# Patient Record
Sex: Male | Born: 2000 | Race: White | Hispanic: No | Marital: Single | Smoking: Never smoker
Health system: Southern US, Community
[De-identification: ages and names within clinical notes are randomized; demographics above are authoritative.]

## PROBLEM LIST (undated history)

## (undated) DIAGNOSIS — N200 Calculus of kidney: Secondary | ICD-10-CM

## (undated) DIAGNOSIS — L309 Dermatitis, unspecified: Secondary | ICD-10-CM

## (undated) HISTORY — DX: Calculus of kidney: N20.0

---
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
## 2021-04-17 ENCOUNTER — Other Ambulatory Visit: Payer: Self-pay

## 2021-04-17 ENCOUNTER — Encounter (HOSPITAL_BASED_OUTPATIENT_CLINIC_OR_DEPARTMENT_OTHER): Payer: Self-pay | Admitting: Emergency Medicine

## 2021-04-17 ENCOUNTER — Emergency Department (HOSPITAL_BASED_OUTPATIENT_CLINIC_OR_DEPARTMENT_OTHER)
Admission: EM | Admit: 2021-04-17 | Discharge: 2021-04-17 | Disposition: A | Discharge status: Home / Self Care | Payer: Medicaid Other | Attending: Emergency Medicine | Admitting: Emergency Medicine

## 2021-04-17 DIAGNOSIS — Z7722 Contact with and (suspected) exposure to environmental tobacco smoke (acute) (chronic): Secondary | ICD-10-CM | POA: Insufficient documentation

## 2021-04-17 DIAGNOSIS — M545 Low back pain, unspecified: Secondary | ICD-10-CM | POA: Insufficient documentation

## 2021-04-17 DIAGNOSIS — U071 COVID-19: Secondary | ICD-10-CM | POA: Insufficient documentation

## 2021-04-17 DIAGNOSIS — R509 Fever, unspecified: Secondary | ICD-10-CM | POA: Diagnosis present

## 2021-04-17 HISTORY — DX: Calculus of kidney: N20.0

## 2021-04-17 LAB — URINALYSIS, ROUTINE W REFLEX MICROSCOPIC
Bilirubin Urine: NEGATIVE
Glucose, UA: NEGATIVE mg/dL
Hgb urine dipstick: NEGATIVE
Ketones, ur: NEGATIVE mg/dL
Leukocytes,Ua: NEGATIVE
Nitrite: NEGATIVE
Protein, ur: NEGATIVE mg/dL
Specific Gravity, Urine: 1.025 (ref 1.005–1.030)
pH: 6 (ref 5.0–8.0)

## 2021-04-17 MED ORDER — IBUPROFEN 400 MG PO TABS
600.0000 mg | ORAL_TABLET | Freq: Once | ORAL | Status: AC
  Administered 2021-04-17: 600 mg via ORAL
  Filled 2021-04-17: qty 1

## 2021-04-17 NOTE — ED Provider Notes (Signed)
[facility] EMERGENCY DEPARTMENT Provider Note   CSN: [id number] Arrival date & time: 04/17/21  1006     History Chief Complaint  Patient presents with   Back Pain    Matthew Hebert is a 20 y.o. male.  Patient with history of kidney stones presents emergency department for evaluation of lower back pain.  States that symptoms started last night.  He denies dysuria or blood noted in the urine.  No vomiting.  He has had a little bit of a headache and chills today despite being a warm environment.  He has a temperature of 100.8 F at time of my exam.  He denies ear pain, eye pain, congestion, sore throat.  No cough.  No vomiting or diarrhea.  No abdominal pain.  Current symptoms are not as severe as when he was diagnosed with a kidney stone.  No known sick contacts.  Onset of symptoms acute.  Course is constant.      Past Medical History:  Diagnosis Date   Eczema    Kidney stone     There are no problems to display for this patient.   History reviewed. No pertinent surgical history.     No family history on file.  Social History   Tobacco Use   Smoking status: Never    Passive exposure: Yes   Smokeless tobacco: Never  Vaping Use   Vaping Use: Every day  Substance Use Topics   Alcohol use: Yes    Comment: beer   Drug use: Yes    Types: Marijuana    Home Medications Prior to Admission medications   Medication Sig Start Date End Date Taking? Authorizing Provider  ibuprofen (ADVIL,MOTRIN) 100 MG/5ML suspension Take 300 mg by mouth once. Patient was given this medication for a headache.    [provider]    Allergies    Patient has no known allergies.  Review of Systems   Review of Systems  Constitutional:  Positive for chills and fever.  HENT:  Negative for rhinorrhea and sore throat.   Eyes:  Negative for redness.  Respiratory:  Negative for cough.   Cardiovascular:  Negative for chest pain.  Gastrointestinal:  Negative for abdominal  pain, diarrhea, nausea and vomiting.  Genitourinary:  Negative for dysuria and hematuria.  Musculoskeletal:  Positive for back pain and myalgias.  Skin:  Negative for rash.  Neurological:  Positive for headaches.   Physical Exam Updated Vital Signs BP 114/65 (BP Location: Right Arm)   Pulse 96   Temp (!) 100.8 F (38.2 C) (Oral)   Resp 20   Ht 6\' 1"  (1.854 m)   Wt 70.3 kg   SpO2 97%   BMI 20.45 kg/m   Physical Exam Vitals and nursing note reviewed.  Constitutional:      General: He is not in acute distress.    Appearance: He is well-developed.     Comments: Temperature 100.8 F orally, measured by myself  HENT:     Head: Normocephalic and atraumatic.     Right Ear: Tympanic membrane, ear canal and external ear normal.     Left Ear: Tympanic membrane, ear canal and external ear normal.     Nose: Nose normal.     Mouth/Throat:     Pharynx: Posterior oropharyngeal erythema (Mild) present. No oropharyngeal exudate.  Eyes:     General:        Right eye: No discharge.        Left eye: No discharge.  Conjunctiva/sclera: Conjunctivae normal.  Neck:     Comments: No meningeal signs. Cardiovascular:     Rate and Rhythm: Normal rate and regular rhythm.     Heart sounds: Normal heart sounds.  Pulmonary:     Effort: Pulmonary effort is normal.     Breath sounds: Normal breath sounds.  Abdominal:     Palpations: Abdomen is soft.     Tenderness: There is no abdominal tenderness. There is no guarding or rebound.  Musculoskeletal:     Cervical back: Normal range of motion and neck supple.  Skin:    General: Skin is warm and dry.  Neurological:     Mental Status: He is alert.    ED Results / Procedures / Treatments   Labs (all labs ordered are listed, but only abnormal results are displayed) Labs Reviewed  SARS CORONAVIRUS 2 (TAT 6-24 HRS)  URINALYSIS, ROUTINE W REFLEX MICROSCOPIC    EKG None  Radiology No results found.  Procedures Procedures   Medications  Ordered in ED Medications  ibuprofen (ADVIL) tablet 600 mg (has no administration in time range)    ED Course  I have reviewed the triage vital signs and the nursing notes.  Pertinent labs & imaging results that were available during my care of the patient were reviewed by me and considered in my medical decision making (see chart for details).  Patient seen and examined.  UA already obtained and is negative.  Patient's symptoms are not clinically consistent with kidney stones.  He does have a worsening fever here making me think that his pain is related to muscle aches from infection.  He appears well, nontoxic.  Low concern for sepsis or meningitis.  We will give OTC NSAIDs, check COVID test.  Encouraged avoidance of others until symptoms are improved and COVID test has returned.  Detailed discussion had with with patient regarding COVID-19 precautions and written instructions given as well.  We discussed need to isolate themselves for 5 days from onset of symptoms and have 24 hours of improvement prior to breaking isolation.  We discussed that when breaking isolation, mask wearing for 5 additional days is required.  We discussed signs symptoms to return which include worsening shortness of breath, trouble breathing, or increased work of breathing.  Also return with persistent vomiting, confusion, passing out, or if they have any other concerns. Counseled on the need for rest and good hydration. Discussed that high-risk contacts should be aware of positive result and they need to quarantine and be tested if they develop any symptoms. Patient verbalizes understanding.   Matthew Hebert was evaluated in Emergency Department on 04/17/2021 for the symptoms described in the history of present illness. He was evaluated in the context of the global COVID-19 pandemic, which necessitated consideration that the patient might be at risk for infection with the SARS-CoV-2 virus that causes COVID-19. Institutional  protocols and algorithms that pertain to the evaluation of patients at risk for COVID-19 are in a state of rapid change based on information released by regulatory bodies including the CDC and federal and state organizations. These policies and algorithms were followed during the patient's care in the ED.   Vital signs reviewed and are as follows: BP 114/65 (BP Location: Right Arm)   Pulse 96   Temp (!) 100.8 F (38.2 C) (Oral)   Resp 20   Ht 6\' 1"  (1.854 m)   Wt 70.3 kg   SpO2 97%   BMI 20.45 kg/m  MDM Rules/Calculators/A&P                          Patient with low back pain, likely muscular.  Normal UA.  No neurological deficits.  Found to have worsening fever here.  Treated as above.  Do not suspect meningitis at this time.  Concern for viral infection or possibly COVID.  Testing sent.  Treatment plan as above.  No high risk features for severe COVID.   Final Clinical Impression(s) / ED Diagnoses Final diagnoses:  Febrile illness  Acute bilateral low back pain without sciatica    Rx / DC Orders ED Discharge Orders     None        Renne Crigler, Cordelia Poche 04/17/21 1128    Tilden Fossa, MD 04/18/21 1234

## 2021-04-17 NOTE — Discharge Instructions (Signed)
Please read and follow all provided instructions.  Your diagnoses today include:  1. Febrile illness   2. Acute bilateral low back pain without sciatica     Tests performed today include: Vital signs. See below for your results today.  Urine test - no sign of blood to suggest kidney stone or sign of infection COVID test - pending, check mychart for results  Medications prescribed:  None  Take any prescribed medications only as directed. Treatment for your infection is aimed at treating the symptoms. There are no medications, such as antibiotics, that will cure your infection.   Home care instructions:  Follow any educational materials contained in this packet.   Your illness is contagious and can be spread to others, especially during the first 3 or 4 days. It cannot be cured by antibiotics or other medicines. Take basic precautions such as washing your hands often, covering your mouth when you cough or sneeze, and avoiding public places where you could spread your illness to others.   Please continue drinking plenty of fluids.  Use over-the-counter medicines as needed as directed on packaging for symptom relief.  You may also use ibuprofen or tylenol as directed on packaging for pain or fever.  Do not take multiple medicines containing Tylenol or acetaminophen to avoid taking too much of this medication.  If you are positive for Covid-19, you should isolate yourself and not be exposed to other people for 5 days after your symptoms began. If you are not feeling better at day 5, you need to isolate yourself for a total of 10 days. If you are feeling better by day 5, you should wear a mask properly, over your nose and mouth, at all times while around other people until 10 days after your symptoms started.   Follow-up instructions: Please follow-up with your primary care provider as needed for further evaluation of your symptoms if you are not feeling better.   Return instructions:  Please  return to the Emergency Department if you experience worsening symptoms.  Return to the emergency department if you have worsening shortness of breath breathing or increased work of breathing, persistent vomiting RETURN IMMEDIATELY IF you develop shortness of breath, confusion or altered mental status, a new rash, become dizzy, faint, or poorly responsive, or are unable to be cared for at home. Please return if you have persistent vomiting and cannot keep down fluids or develop a fever that is not controlled by tylenol or motrin.   Please return if you have any other emergent concerns.  Additional Information:  Your vital signs today were: BP 114/65 (BP Location: Right Arm)   Pulse 96   Temp (!) 100.8 F (38.2 C) (Oral)   Resp 20   Ht 6\' 1"  (1.854 m)   Wt 70.3 kg   SpO2 97%   BMI 20.45 kg/m  If your blood pressure (BP) was elevated above 135/85 this visit, please have this repeated by your doctor within one month. --------------

## 2021-04-17 NOTE — ED Triage Notes (Signed)
States," My kidneys are hurting me" Hx of kidney stone x 1 year ago. Lower back pain, non-radiating, no dysuria

## 2021-04-18 LAB — SARS CORONAVIRUS 2 (TAT 6-24 HRS): SARS Coronavirus 2: POSITIVE — AB

## 2021-10-12 ENCOUNTER — Emergency Department (HOSPITAL_BASED_OUTPATIENT_CLINIC_OR_DEPARTMENT_OTHER)
Admission: EM | Admit: 2021-10-12 | Discharge: 2021-10-12 | Disposition: A | Discharge status: Home / Self Care | Payer: Medicaid Other | Attending: Emergency Medicine | Admitting: Emergency Medicine

## 2021-10-12 ENCOUNTER — Emergency Department (HOSPITAL_BASED_OUTPATIENT_CLINIC_OR_DEPARTMENT_OTHER): Payer: Medicaid Other

## 2021-10-12 ENCOUNTER — Other Ambulatory Visit: Payer: Self-pay

## 2021-10-12 ENCOUNTER — Encounter (HOSPITAL_BASED_OUTPATIENT_CLINIC_OR_DEPARTMENT_OTHER): Payer: Self-pay | Admitting: Emergency Medicine

## 2021-10-12 DIAGNOSIS — R0789 Other chest pain: Secondary | ICD-10-CM | POA: Diagnosis present

## 2021-10-12 DIAGNOSIS — Z20822 Contact with and (suspected) exposure to covid-19: Secondary | ICD-10-CM | POA: Diagnosis not present

## 2021-10-12 DIAGNOSIS — R079 Chest pain, unspecified: Secondary | ICD-10-CM

## 2021-10-12 DIAGNOSIS — R110 Nausea: Secondary | ICD-10-CM | POA: Insufficient documentation

## 2021-10-12 DIAGNOSIS — Z7722 Contact with and (suspected) exposure to environmental tobacco smoke (acute) (chronic): Secondary | ICD-10-CM | POA: Diagnosis not present

## 2021-10-12 DIAGNOSIS — R630 Anorexia: Secondary | ICD-10-CM | POA: Diagnosis not present

## 2021-10-12 LAB — RESP PANEL BY RT-PCR (FLU A&B, COVID) ARPGX2
Influenza A by PCR: NEGATIVE
Influenza B by PCR: NEGATIVE
SARS Coronavirus 2 by RT PCR: NEGATIVE

## 2021-10-12 MED ORDER — ONDANSETRON 4 MG PO TBDP
4.0000 mg | ORAL_TABLET | Freq: Once | ORAL | Status: AC
  Administered 2021-10-12: 4 mg via ORAL
  Filled 2021-10-12: qty 1

## 2021-10-12 MED ORDER — ONDANSETRON HCL 4 MG PO TABS: 4.0000 mg | ORAL_TABLET | Freq: Four times a day (QID) | ORAL | 0 refills | Status: AC

## 2021-10-12 MED ORDER — ALUM & MAG HYDROXIDE-SIMETH 200-200-20 MG/5ML PO SUSP
30.0000 mL | Freq: Once | ORAL | Status: AC
  Administered 2021-10-12: 30 mL via ORAL
  Filled 2021-10-12: qty 30

## 2021-10-12 MED ORDER — FAMOTIDINE 20 MG PO TABS: 20.0000 mg | ORAL_TABLET | Freq: Two times a day (BID) | ORAL | 0 refills | Status: AC

## 2021-10-12 MED ORDER — LIDOCAINE VISCOUS HCL 2 % MT SOLN
15.0000 mL | Freq: Once | OROMUCOSAL | Status: AC
  Administered 2021-10-12: 15 mL via ORAL
  Filled 2021-10-12: qty 15

## 2021-10-12 NOTE — ED Triage Notes (Signed)
Pt presents to ED POV. Pt c/o intermittent CP x3d. Pt reports that pain is in different location every time it happens. Pt reports significant cardiac hx in his family.

## 2021-10-12 NOTE — Discharge Instructions (Signed)
Take pepcid and zofran as prescribed   Please follow up with your regular doctor and your heart doctor   Please return to the emergency department for any new or worsening symptoms.

## 2021-10-12 NOTE — ED Provider Notes (Signed)
[facility] EMERGENCY DEPT Provider Note   CSN: [id number] Arrival date & time: 10/12/21  1934     History Chief Complaint  Patient presents with   Chest Pain    OMRAN NOHR is a 20 y.o. male.  HPI   Pt is a 20 y/o male who presents to the ED today for eval of chest pain. States sxs have been intermittent for the last 3 days. Sxs are located to different areas of his chest and seem to move around. States it feels like gas. He does not have any symptoms currently. He has had some nausea with eating as well. Denies abd pain, vomiting, sob, cough, pleuritic pain, fever.  Past Medical History:  Diagnosis Date   Eczema    Kidney stone     There are no problems to display for this patient.   History reviewed. No pertinent surgical history.     History reviewed. No pertinent family history.  Social History   Tobacco Use   Smoking status: Never    Passive exposure: Yes   Smokeless tobacco: Never  Vaping Use   Vaping Use: Every day  Substance Use Topics   Alcohol use: Yes    Comment: beer   Drug use: Yes    Types: Marijuana    Home Medications Prior to Admission medications   Medication Sig Start Date End Date Taking? Authorizing Provider  famotidine (PEPCID) 20 MG tablet Take 1 tablet (20 mg total) by mouth 2 (two) times daily. 10/12/21  Yes Shiraz Bastyr S, PA-C  ondansetron (ZOFRAN) 4 MG tablet Take 1 tablet (4 mg total) by mouth every 6 (six) hours. 10/12/21  Yes Regena Delucchi S, PA-C  ibuprofen (ADVIL,MOTRIN) 100 MG/5ML suspension Take 300 mg by mouth once. Patient was given this medication for a headache.    [provider]    Allergies    Patient has no known allergies.  Review of Systems   Review of Systems  Constitutional:  Negative for fever.  HENT:  Negative for ear pain and sore throat.   Eyes:  Negative for visual disturbance.  Respiratory:  Negative for cough and shortness of breath.   Cardiovascular:  Positive  for chest pain.  Gastrointestinal:  Positive for nausea. Negative for abdominal pain, constipation, diarrhea and vomiting.  Genitourinary:  Negative for dysuria and hematuria.  Musculoskeletal:  Positive for arthralgias. Negative for back pain.  Skin:  Positive for color change. Negative for rash.  Neurological:  Negative for headaches.  All other systems reviewed and are negative.  Physical Exam Updated Vital Signs BP 126/72 (BP Location: Left Arm)   Pulse (!) 101   Temp 98.6 F (37 C)   Resp 18   Ht 6' (1.829 m)   Wt 68 kg   SpO2 96%   BMI 20.34 kg/m   Physical Exam Vitals and nursing note reviewed.  Constitutional:      General: He is not in acute distress.    Appearance: He is well-developed.  HENT:     Head: Normocephalic and atraumatic.  Eyes:     Conjunctiva/sclera: Conjunctivae normal.  Cardiovascular:     Rate and Rhythm: Normal rate and regular rhythm.     Heart sounds: Normal heart sounds. No murmur heard. Pulmonary:     Effort: Pulmonary effort is normal. No respiratory distress.     Breath sounds: Normal breath sounds. No decreased breath sounds, wheezing, rhonchi or rales.  Abdominal:     Palpations: Abdomen is soft.  Tenderness: There is no abdominal tenderness.  Musculoskeletal:        General: No swelling.     Cervical back: Neck supple.  Skin:    General: Skin is warm and dry.     Capillary Refill: Capillary refill takes less than 2 seconds.  Neurological:     Mental Status: He is alert.  Psychiatric:        Mood and Affect: Mood normal.    ED Results / Procedures / Treatments   Labs (all labs ordered are listed, but only abnormal results are displayed) Labs Reviewed  RESP PANEL BY RT-PCR (FLU A&B, COVID) [id number]    EKG None  Radiology DG Chest Port 1 View  Result Date: 10/12/2021 CLINICAL DATA:  Chest pain EXAM: PORTABLE CHEST 1 VIEW COMPARISON:  None. FINDINGS: Normal mediastinum and cardiac silhouette. Normal pulmonary  vasculature. No evidence of effusion, infiltrate, or pneumothorax. No acute bony abnormality. IMPRESSION: Normal chest radiograph. Electronically Signed   By: Genevive Bi M.D.   On: 10/12/2021 20:52    Procedures Procedures   Medications Ordered in ED Medications  alum & mag hydroxide-simeth (MAALOX/MYLANTA) 200-200-20 MG/5ML suspension 30 mL (30 mLs Oral Given 10/12/21 2102)    And  lidocaine (XYLOCAINE) 2 % viscous mouth solution 15 mL (15 mLs Oral Given 10/12/21 2059)  ondansetron (ZOFRAN-ODT) disintegrating tablet 4 mg (4 mg Oral Given 10/12/21 2055)    ED Course  I have reviewed the triage vital signs and the nursing notes.  Pertinent labs & imaging results that were available during my care of the patient were reviewed by me and considered in my medical decision making (see chart for details).    MDM Rules/Calculators/A&P                          20 year old male presents with intermittent chest pain that moves around his chest and last for few minutes at a time resolved on its own.  At the time of my evaluation he is not any chest pain.  He describes it as a gas feeling.  He is further reporting nausea whenever he eats and decreased appetite.  I suspect that his symptoms are more likely related to a GI cause rather than from a cardiac or pulmonary cause.  His chest x-ray is clear and his EKG is unremarkable.  I did send a COVID and flu test due to his lack of appetite and this is pending at the time of discharge.  Patient will follow up on his MyChart for the result.  I gave him a GI cocktail as well as some Zofran in the ED and on reassessment he actually states that he feels improved.  This further supports suspicion that this is likely GI related.  I will start him on Pepcid and Zofran for home.  Have advised that he follow-up with his PCP and/cardiologist for further evaluation of his symptoms.  I have low suspicion for any emergent cause at this time and feel he is appropriate  for discharge with outpatient follow-up.  He voiced understanding the plan and reasons to return.  Questions answered.  Patient stable for discharge.   Final Clinical Impression(s) / ED Diagnoses Final diagnoses:  Atypical chest pain    Rx / DC Orders ED Discharge Orders          Ordered    ondansetron (ZOFRAN) 4 MG tablet  Every 6 hours        10/12/21 2133  famotidine (PEPCID) 20 MG tablet  2 times daily        10/12/21 2133             Rodney Booze, PA-C 10/12/21 2138    Wynona Dove A, DO 10/14/21 0730

## 2021-11-07 ENCOUNTER — Emergency Department (HOSPITAL_BASED_OUTPATIENT_CLINIC_OR_DEPARTMENT_OTHER)
Admission: EM | Admit: 2021-11-07 | Discharge: 2021-11-07 | Disposition: A | Discharge status: Home / Self Care | Payer: Medicaid Other | Attending: Emergency Medicine | Admitting: Emergency Medicine

## 2021-11-07 ENCOUNTER — Other Ambulatory Visit: Payer: Self-pay

## 2021-11-07 ENCOUNTER — Encounter (HOSPITAL_BASED_OUTPATIENT_CLINIC_OR_DEPARTMENT_OTHER): Payer: Self-pay | Admitting: Obstetrics and Gynecology

## 2021-11-07 DIAGNOSIS — R1031 Right lower quadrant pain: Secondary | ICD-10-CM | POA: Diagnosis not present

## 2021-11-07 DIAGNOSIS — R109 Unspecified abdominal pain: Secondary | ICD-10-CM

## 2021-11-07 LAB — COMPREHENSIVE METABOLIC PANEL
ALT: 39 U/L (ref 0–44)
AST: 26 U/L (ref 15–41)
Albumin: 4.7 g/dL (ref 3.5–5.0)
Alkaline Phosphatase: 65 U/L (ref 38–126)
Anion gap: 8 (ref 5–15)
BUN: 11 mg/dL (ref 6–20)
CO2: 27 mmol/L (ref 22–32)
Calcium: 9.8 mg/dL (ref 8.9–10.3)
Chloride: 106 mmol/L (ref 98–111)
Creatinine, Ser: 0.86 mg/dL (ref 0.61–1.24)
GFR, Estimated: >60 mL/min (ref >60)
Glucose, Bld: 84 mg/dL (ref 70–99)
Potassium: 4 mmol/L (ref 3.5–5.1)
Sodium: 141 mmol/L (ref 135–145)
Total Bilirubin: 0.6 mg/dL (ref 0.3–1.2)
Total Protein: 7.5 g/dL (ref 6.5–8.1)

## 2021-11-07 LAB — CBC
HCT: 41.5 % (ref 39.0–52.0)
Hemoglobin: 14.3 g/dL (ref 13.0–17.0)
MCH: 30.4 pg (ref 26.0–34.0)
MCHC: 34.5 g/dL (ref 30.0–36.0)
MCV: 88.1 fL (ref 80.0–100.0)
Platelets: 240 10*3/uL (ref 150–400)
RBC: 4.71 MIL/uL (ref 4.22–5.81)
RDW: 11.8 % (ref 11.5–15.5)
WBC: 5 10*3/uL (ref 4.0–10.5)
nRBC: 0 % (ref 0.0–0.2)

## 2021-11-07 LAB — URINALYSIS, ROUTINE W REFLEX MICROSCOPIC
Bilirubin Urine: NEGATIVE
Glucose, UA: NEGATIVE mg/dL
Hgb urine dipstick: NEGATIVE
Ketones, ur: NEGATIVE mg/dL
Leukocytes,Ua: NEGATIVE
Nitrite: NEGATIVE
Protein, ur: NEGATIVE mg/dL
Specific Gravity, Urine: 1.028 (ref 1.005–1.030)
pH: 5.5 (ref 5.0–8.0)

## 2021-11-07 LAB — LIPASE, BLOOD: Lipase: 36 U/L (ref 11–51)

## 2021-11-07 NOTE — Discharge Instructions (Signed)
You can take over-the-counter medications as needed.  Return to the ER if you start having  pain, vomiting, fever

## 2021-11-07 NOTE — ED Notes (Signed)
RN provided AVS using Teachback Method. Patient verbalizes understanding of Discharge Instructions. Opportunity for Questioning and Answers were provided by RN. Patient Discharged from ED ambulatory to Home with Family. ? ?

## 2021-11-07 NOTE — ED Provider Notes (Signed)
[facility] EMERGENCY DEPT Provider Note   CSN: [id number] Arrival date & time: 11/07/21  1314     History  Chief Complaint  Patient presents with   Abdominal Pain    Matthew Hebert is a 21 y.o. male.   Abdominal Pain  Patient presented to the ED for abdominal evaluation.  Patient does not have any history of any significant medical problems. Patient states he started having some cramping pain in his lower abdomen more on the right side.  Initially did not think it was too severe.  He was able to go to sleep however woke up this morning the pain was still there.  He was concerned decided come in for evaluation.  While he has been waiting the symptoms have all resolved.  Does not have any pain or discomfort now.  He has not had any issues with nausea vomiting.  He is not having any issue with diarrhea or dysuria. Home Medications Prior to Admission medications   Medication Sig Start Date End Date Taking? Authorizing Provider  famotidine (PEPCID) 20 MG tablet Take 1 tablet (20 mg total) by mouth 2 (two) times daily. 10/12/21   Couture, Cortni S, PA-C  ibuprofen (ADVIL,MOTRIN) 100 MG/5ML suspension Take 300 mg by mouth once. Patient was given this medication for a headache.    [provider]  ondansetron (ZOFRAN) 4 MG tablet Take 1 tablet (4 mg total) by mouth every 6 (six) hours. 10/12/21   Couture, Cortni S, PA-C      Allergies    Patient has no known allergies.    Review of Systems   Review of Systems  Gastrointestinal:  Positive for abdominal pain.  All other systems reviewed and are negative.  Physical Exam Updated Vital Signs BP 121/68 (BP Location: Right Arm)    Pulse (!) 59    Temp (!) 97.5 F (36.4 C)    Resp 18    SpO2 100%  Physical Exam Vitals and nursing note reviewed.  Constitutional:      General: He is not in acute distress.    Appearance: He is well-developed.  HENT:     Head: Normocephalic and atraumatic.     Right Ear: External  ear normal.     Left Ear: External ear normal.  Eyes:     General: No scleral icterus.       Right eye: No discharge.        Left eye: No discharge.     Conjunctiva/sclera: Conjunctivae normal.  Neck:     Trachea: No tracheal deviation.  Cardiovascular:     Rate and Rhythm: Normal rate and regular rhythm.  Pulmonary:     Effort: Pulmonary effort is normal. No respiratory distress.     Breath sounds: Normal breath sounds. No stridor. No wheezing or rales.  Abdominal:     General: Bowel sounds are normal. There is no distension.     Palpations: Abdomen is soft.     Tenderness: There is no abdominal tenderness. There is no guarding or rebound. Negative signs include McBurney's sign.  Musculoskeletal:        General: No tenderness or deformity.     Cervical back: Neck supple.  Skin:    General: Skin is warm and dry.     Findings: No rash.  Neurological:     General: No focal deficit present.     Mental Status: He is alert.     Cranial Nerves: No cranial nerve deficit (no facial droop, extraocular  movements intact, no slurred speech).     Sensory: No sensory deficit.     Motor: No abnormal muscle tone or seizure activity.     Coordination: Coordination normal.  Psychiatric:        Mood and Affect: Mood normal.    ED Results / Procedures / Treatments   Labs (all labs ordered are listed, but only abnormal results are displayed) Labs Reviewed  LIPASE, BLOOD  COMPREHENSIVE METABOLIC PANEL  CBC  URINALYSIS, ROUTINE W REFLEX MICROSCOPIC    EKG None  Radiology No results found.  Procedures Procedures    Medications Ordered in ED Medications - No data to display  ED Course/ Medical Decision Making/ A&P Clinical Course as of 11/07/21 1722  Fri Nov 07, 2021  1713 CBC normal.  Metabolic panel normal.  Lipase panel normal.  Urinalysis normal [JK]    Clinical Course User Index [JK] Linwood Dibbles, MD                           Medical Decision Making  Patient presented  to the ED for evaluation of abdominal pain concerned about the possibility ureteral colic or appendicitis.  Patient's exam is completely benign.  He has no focal tenderness.  Laboratory tests are reassuring.  Discussed options with the patient regarding the CT scan and observation and close follow-up.  At this time I do not think CT is necessary and he agrees with this and is comfortable with this plan.  Patient understands return to the ED for any worsening symptoms fevers chills vomiting.        Final Clinical Impression(s) / ED Diagnoses Final diagnoses:  Abdominal pain, unspecified abdominal location    Rx / DC Orders ED Discharge Orders     None         Linwood Dibbles, MD 11/07/21 1722

## 2021-11-07 NOTE — ED Triage Notes (Signed)
Patient reports to the ER for abdominal pain x2 days. Patient reports pain with walking and states it is periumbilical pain. Patient has rebound tenderness to RLQ.

## 2023-02-28 ENCOUNTER — Other Ambulatory Visit: Payer: Self-pay

## 2023-02-28 ENCOUNTER — Emergency Department (HOSPITAL_BASED_OUTPATIENT_CLINIC_OR_DEPARTMENT_OTHER)
Admission: EM | Admit: 2023-02-28 | Discharge: 2023-02-28 | Disposition: A | Discharge status: Home / Self Care | Payer: Medicaid Other | Attending: Emergency Medicine | Admitting: Emergency Medicine

## 2023-02-28 ENCOUNTER — Encounter (HOSPITAL_BASED_OUTPATIENT_CLINIC_OR_DEPARTMENT_OTHER): Payer: Self-pay | Admitting: Emergency Medicine

## 2023-02-28 DIAGNOSIS — S61211A Laceration without foreign body of left index finger without damage to nail, initial encounter: Secondary | ICD-10-CM | POA: Insufficient documentation

## 2023-02-28 DIAGNOSIS — S6992XA Unspecified injury of left wrist, hand and finger(s), initial encounter: Secondary | ICD-10-CM | POA: Diagnosis present

## 2023-02-28 DIAGNOSIS — W268XXA Contact with other sharp object(s), not elsewhere classified, initial encounter: Secondary | ICD-10-CM | POA: Diagnosis not present

## 2023-02-28 MED ORDER — LIDOCAINE HCL (PF) 1 % IJ SOLN
30.0000 mL | Freq: Once | INTRAMUSCULAR | Status: AC
  Administered 2023-02-28: 30 mL
  Filled 2023-02-28: qty 30

## 2023-02-28 NOTE — Discharge Instructions (Signed)
We placed 3 stitches to your finger. These will need removed in 7-10 days at a primary care, urgent care, or ER. Please keep the wound clean, dry, and bandaged to prevent infection as we discussed.  We did not update your tetanus vaccination but if you decide you would like this you can get it at any clinic or pharmacy.   If you develop any new or worsening symptoms such as redness around the wound, pus like drainage from wound, worsening pain, fever, or other new, concerning symptoms, please return to nearest emergency department for re-evaluation.

## 2023-02-28 NOTE — ED Triage Notes (Signed)
Pt via pov from home with laceration to his left index finger from a soda can. Pt has bleeding mostly controlled. Pt alert & oriented, nad noted.

## 2023-02-28 NOTE — ED Provider Notes (Signed)
[place] EMERGENCY DEPARTMENT AT [facility] Provider Note   CSN: [id number] Arrival date & time: 02/28/23  1740     History  Chief Complaint  Patient presents with   Extremity Laceration    Matthew Hebert is a 22 y.o. male with no past medical history who presents to the ED complaining of a laceration to his left second finger.  Patient states this afternoon he accidentally cut it on a insert again.  Tetanus not up-to-date but patient does not desire to receive this today.  He denies any pieces of the cam breaking off, foreign body sensation, or other injuries or complaints.      Home Medications No daily medications  Allergies    Patient has no known allergies.    Review of Systems   Review of Systems  All other systems reviewed and are negative.   Physical Exam Updated Vital Signs BP 123/84   Pulse 100   Temp 98.7 F (37.1 C) (Oral)   Resp 18   Ht 6' (1.829 m)   Wt 79.4 kg   SpO2 98%   BMI 23.73 kg/m  Physical Exam Vitals and nursing note reviewed.  Constitutional:      General: He is not in acute distress.    Appearance: Normal appearance.  HENT:     Head: Normocephalic and atraumatic.     Mouth/Throat:     Mouth: Mucous membranes are moist.  Eyes:     Conjunctiva/sclera: Conjunctivae normal.  Cardiovascular:     Rate and Rhythm: Normal rate and regular rhythm.     Heart sounds: No murmur heard. Pulmonary:     Effort: Pulmonary effort is normal.     Breath sounds: Normal breath sounds.  Abdominal:     General: Abdomen is flat.     Palpations: Abdomen is soft.     Tenderness: There is no abdominal tenderness.  Musculoskeletal:     Cervical back: Neck supple.     Right lower leg: No edema.     Left lower leg: No edema.     Comments: 1.5cm curvilinear laceration to the distal palmar aspect of the left second finger, range of motion intact, sensation intact, good capillary refill, able to explore entirety of wound without foreign body  visualization, no deformity, no signs of tendon or vascular injury, minimal oozing blood  Skin:    General: Skin is warm and dry.     Capillary Refill: Capillary refill takes less than 2 seconds.  Neurological:     Mental Status: He is alert. Mental status is at baseline.  Psychiatric:        Behavior: Behavior normal.     ED Results / Procedures / Treatments   Labs (all labs ordered are listed, but only abnormal results are displayed) Labs Reviewed - No data to display  EKG None  Radiology No results found.  Procedures .Marland KitchenLaceration Repair  Date/Time: 02/28/2023 8:00 PM  Performed by: Tonette Lederer, PA-C Authorized by: Tonette Lederer, PA-C   Consent:    Consent obtained:  Verbal   Consent given by:  Patient   Risks, benefits, and alternatives were discussed: yes     Risks discussed:  Infection, need for additional repair, pain, poor cosmetic result, poor wound healing and retained foreign body   Alternatives discussed:  No treatment, delayed treatment and observation Universal protocol:    Procedure explained and questions answered to patient or proxy's satisfaction: yes     Immediately prior to procedure,  a time out was called: yes     Patient identity confirmed:  Verbally with patient Anesthesia:    Anesthesia method:  Local infiltration   Local anesthetic:  Lidocaine 1% w/o epi Laceration details:    Location:  Finger   Finger location:  L index finger   Length (cm):  1.5 Pre-procedure details:    Preparation:  Patient was prepped and draped in usual sterile fashion Exploration:    Limited defect created (wound extended): no     Hemostasis achieved with:  Direct pressure   Imaging outcome: foreign body not noted     Wound exploration: wound explored through full range of motion and entire depth of wound visualized     Contaminated: no   Treatment:    Area cleansed with:  Povidone-iodine and saline   Amount of cleaning:  Extensive   Irrigation solution:   Sterile saline   Irrigation method:  Tap and syringe   Visualized foreign bodies/material removed: no     Debridement:  Minimal   Undermining:  None Skin repair:    Repair method:  Sutures   Suture size:  4-0   Suture material:  Nylon   Suture technique:  Simple interrupted   Number of sutures:  3 Approximation:    Approximation:  Close Repair type:    Repair type:  Simple Post-procedure details:    Dressing:  Antibiotic ointment and non-adherent dressing   Procedure completion:  Tolerated     Medications Ordered in ED Medications  lidocaine (PF) (XYLOCAINE) 1 % injection 30 mL (30 mLs Infiltration Given 02/28/23 1920)    ED Course/ Medical Decision Making/ A&P                             Medical Decision Making  Medical Decision Making:   Matthew Hebert is a 22 y.o. male who presented to the ED today with laceration detailed above.    Additional history discussed with patient's family/caregivers.  Complete initial physical exam performed, notably the patient  was in no acute distress.  Approximately 1.5 cm curvilinear laceration to the distal aspect of the left second finger.  Neurovascularly intact.  No deformity.  No appreciable foreign body.    Reviewed and confirmed nursing documentation for past medical history, family history, social history.    Initial Assessment:   With the patient's presentation of laceration, differential diagnosis includes but is not limited to simple vs intermediate vs complex laceration, tendon injury, nerve injury, retained foreign body, fracture, dislocation, wound infection, skin tear, abrasion.  This is most consistent with an acute complicated illness  Initial Plan:  Tdap booster not up-to-date.  Patient declined during today's visit. Extensive irrigation performed  Pain management as needed.  Patient declined. Laceration repair as indicated Objective evaluation as reviewed   Final Assessment and Plan:   Laceration occurred < 8  hours prior to repair which was tolerated.  3 simple interrupted sutures were placed to the wound.  Patient likely would have benefited from an additional suture, however, declined this.  Despite multiple attempts at redirecting, patient continuously bent to the digit at the PIP joint complicating the procedure though fair approximation was able to be obtained.  Pt has no co morbidities to affect normal wound healing.  No indication for antibiotics at this time.  Discussed suture home care with pt and answered questions. Pt to follow up for wound check and suture removal in 7 days  or earlier for any concerns. Pt is hemodynamically stable with no complaints prior to discharge. Strict ED return precautions given for wound complications, infection, recurrent injury, etc.     Clinical Impression:  1. Laceration of left index finger without foreign body without damage to nail, initial encounter      Discharge           Final Clinical Impression(s) / ED Diagnoses Final diagnoses:  Laceration of left index finger without foreign body without damage to nail, initial encounter    Rx / DC Orders ED Discharge Orders     None         Richardson Dopp 02/28/23 2035    Virgina Norfolk, DO 02/28/23 2117
